# Patient Record
Sex: Female | Born: 2000 | Race: White | Hispanic: No | Marital: Single | Smoking: Never smoker
Health system: Southern US, Community
[De-identification: ages and names within clinical notes are randomized; demographics above are authoritative.]

## PROBLEM LIST (undated history)

## (undated) DIAGNOSIS — J45909 Unspecified asthma, uncomplicated: Secondary | ICD-10-CM

---
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
## 2020-12-13 IMAGING — DX DG CHEST 1V PORT
1 series · 1 of 1 positions shown · non-contrast
Comparison: None.

CLINICAL DATA: Shortness of breath.

EXAM:
PORTABLE CHEST 1 VIEW

[chest ap]
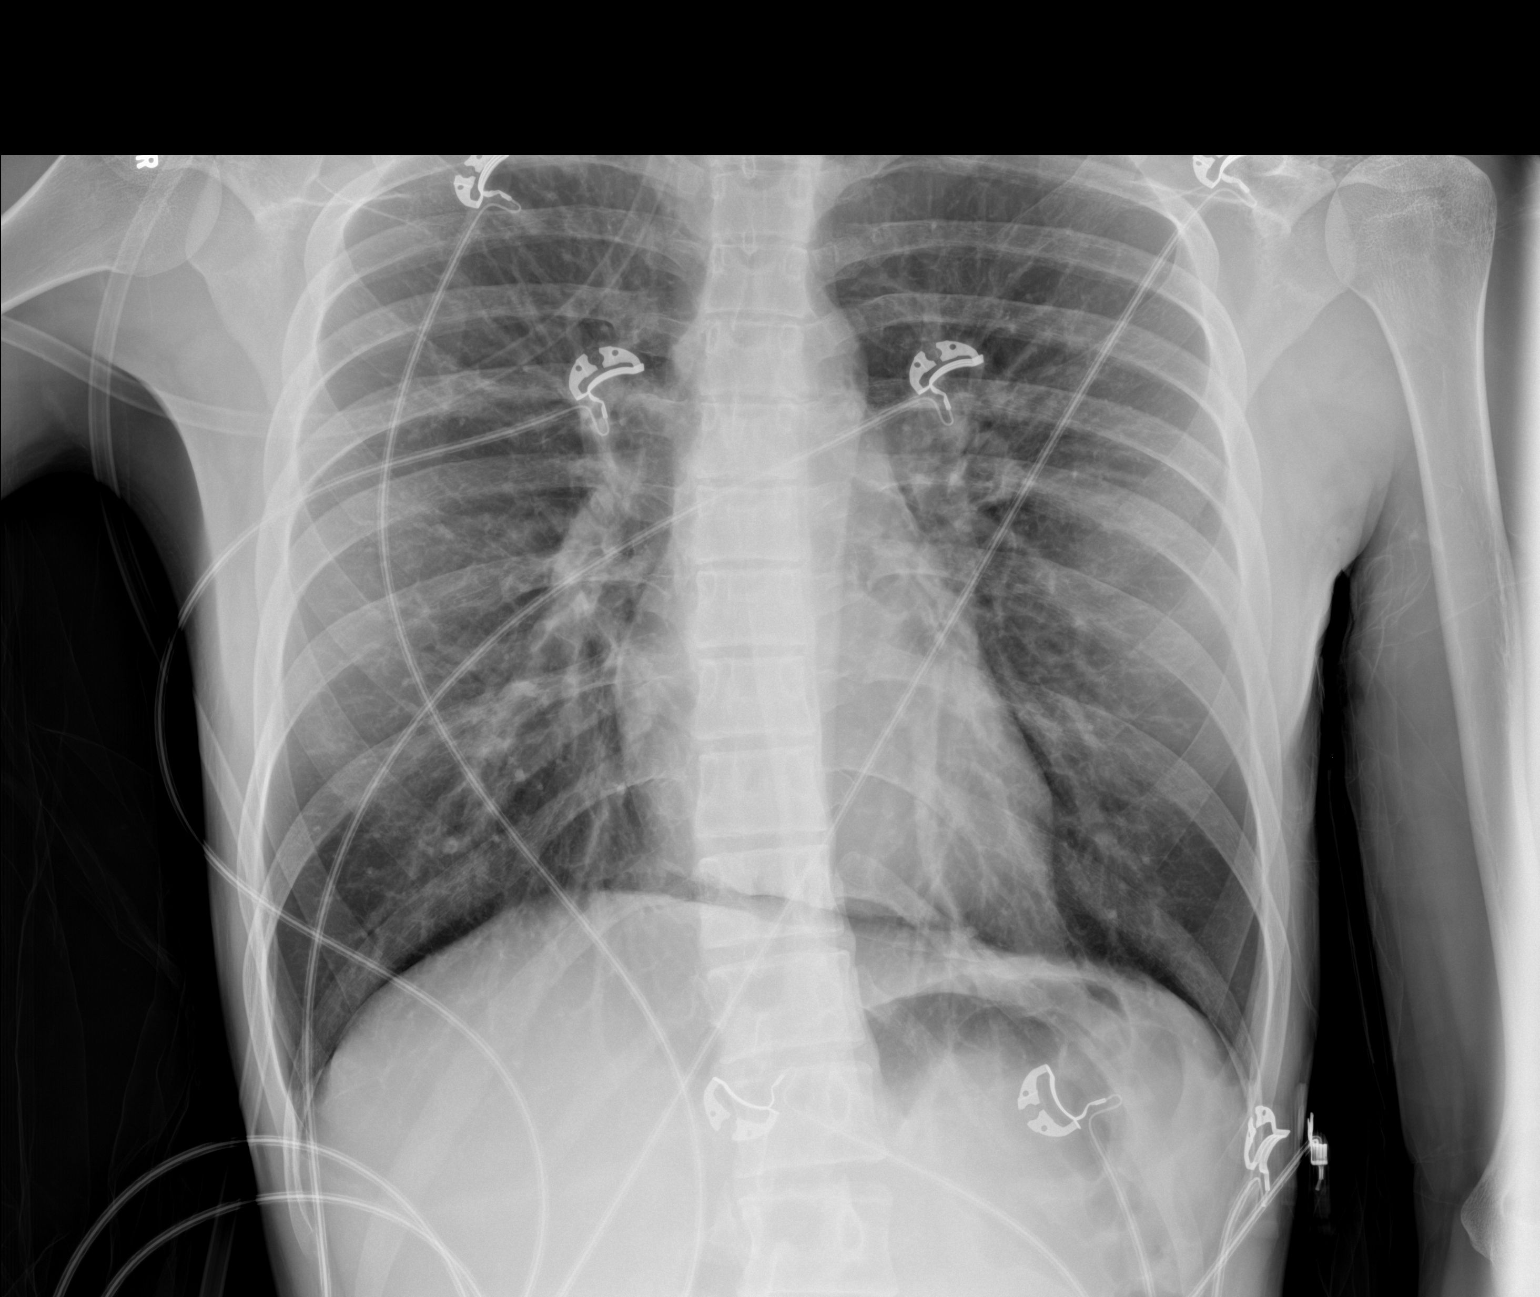

[1 of 1 positions shown; findings below may reference images not displayed]

FINDINGS: There is no pneumothorax. No focal infiltrate. No large pleural
effusion. The heart size is normal. There is a lucency projecting
beneath the cardiac silhouette. There is no acute osseous
abnormality.
IMPRESSION: 1. No definite acute cardiopulmonary process.
2. Lucency projecting beneath the cardiac silhouette is likely
artifact, however a follow-up two-view chest x-ray is recommended.

## 2020-12-13 IMAGING — CR DG CHEST 2V
1 series · 2 of 2 positions shown · non-contrast
Comparison: Chest x-ray from earlier today

CLINICAL DATA: Abnormal one view chest x-ray

EXAM:
CHEST - 2 VIEW

[Series 1: dg chest 2 view · 0.14mm/px · 2 of 2 slices shown]
[im 1/2]
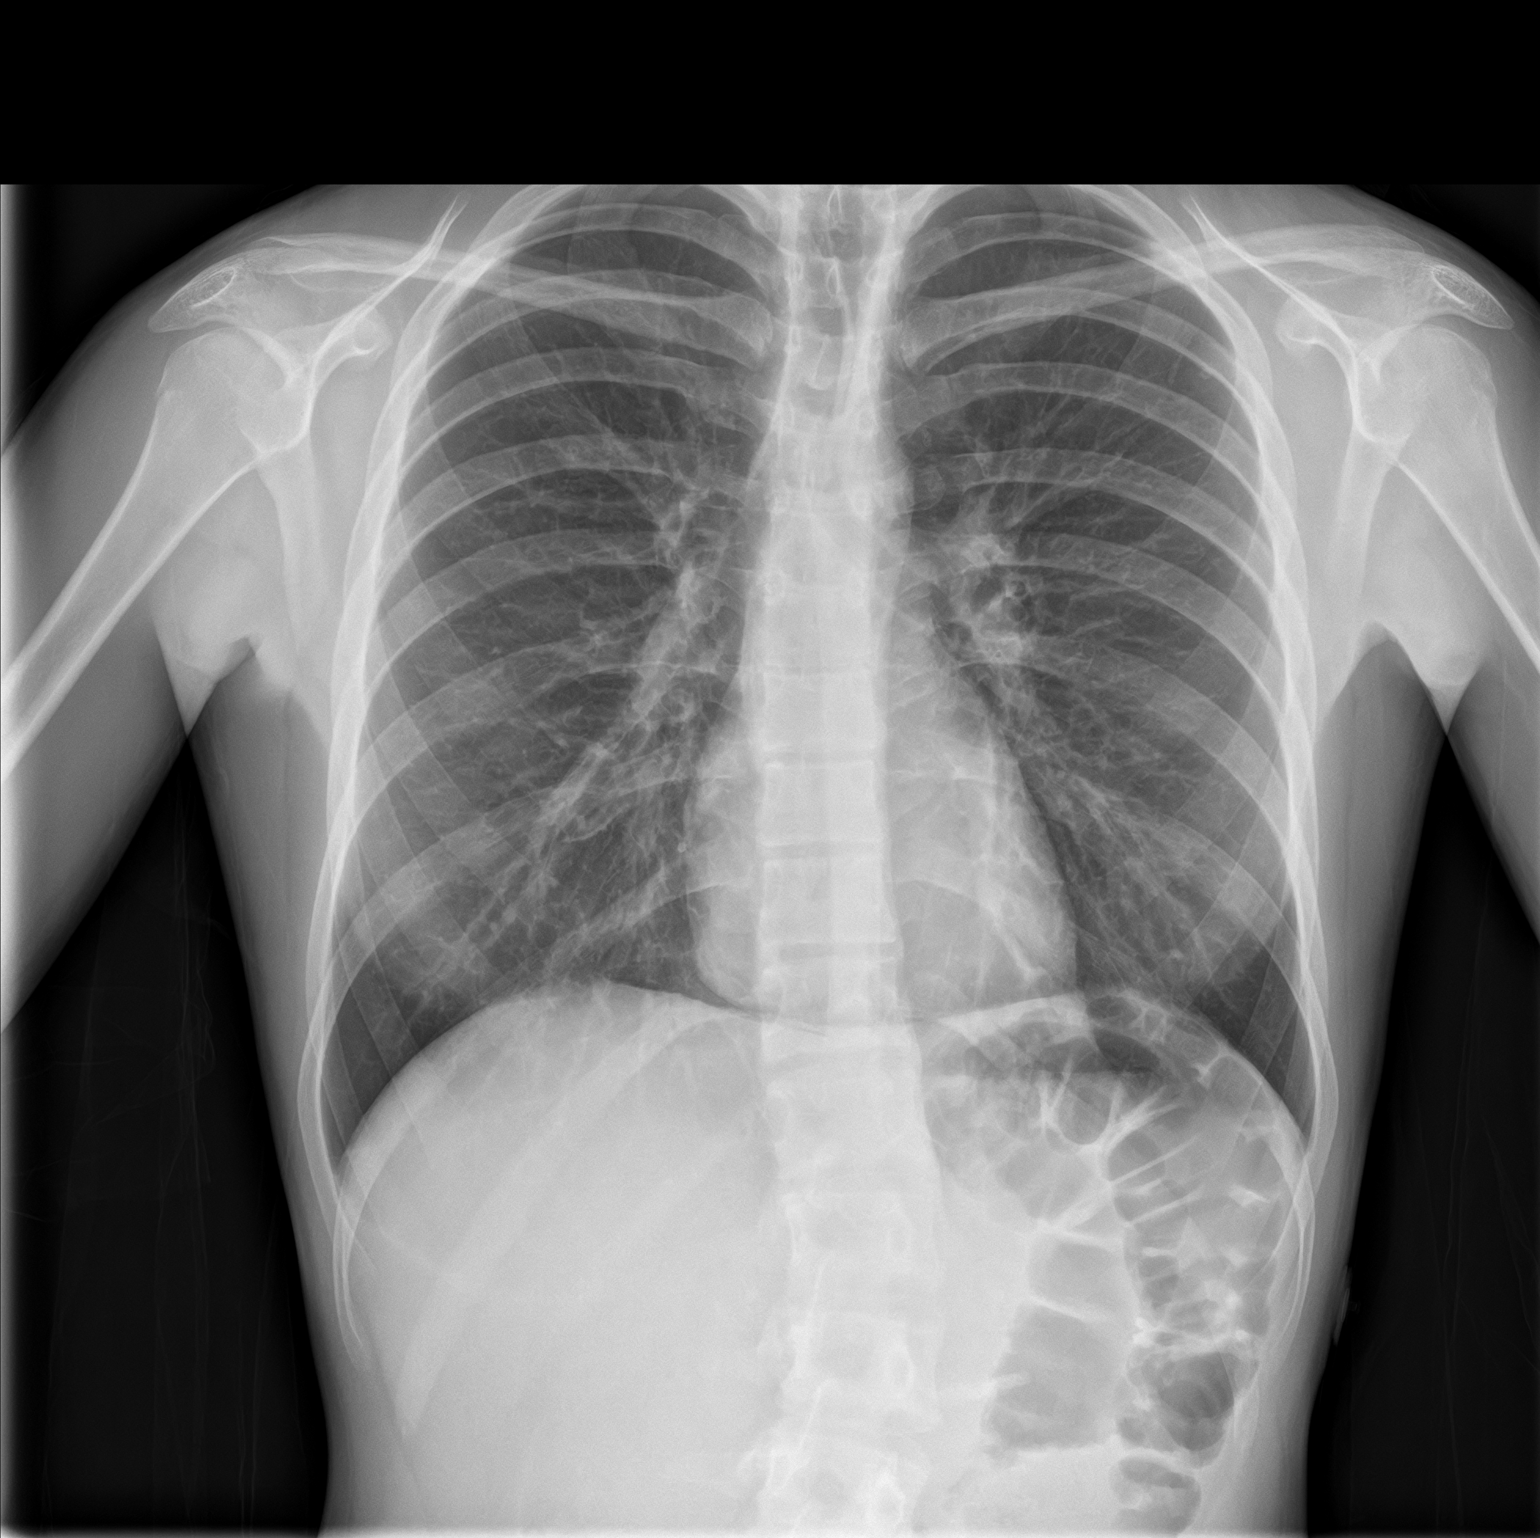
[im 2/2]
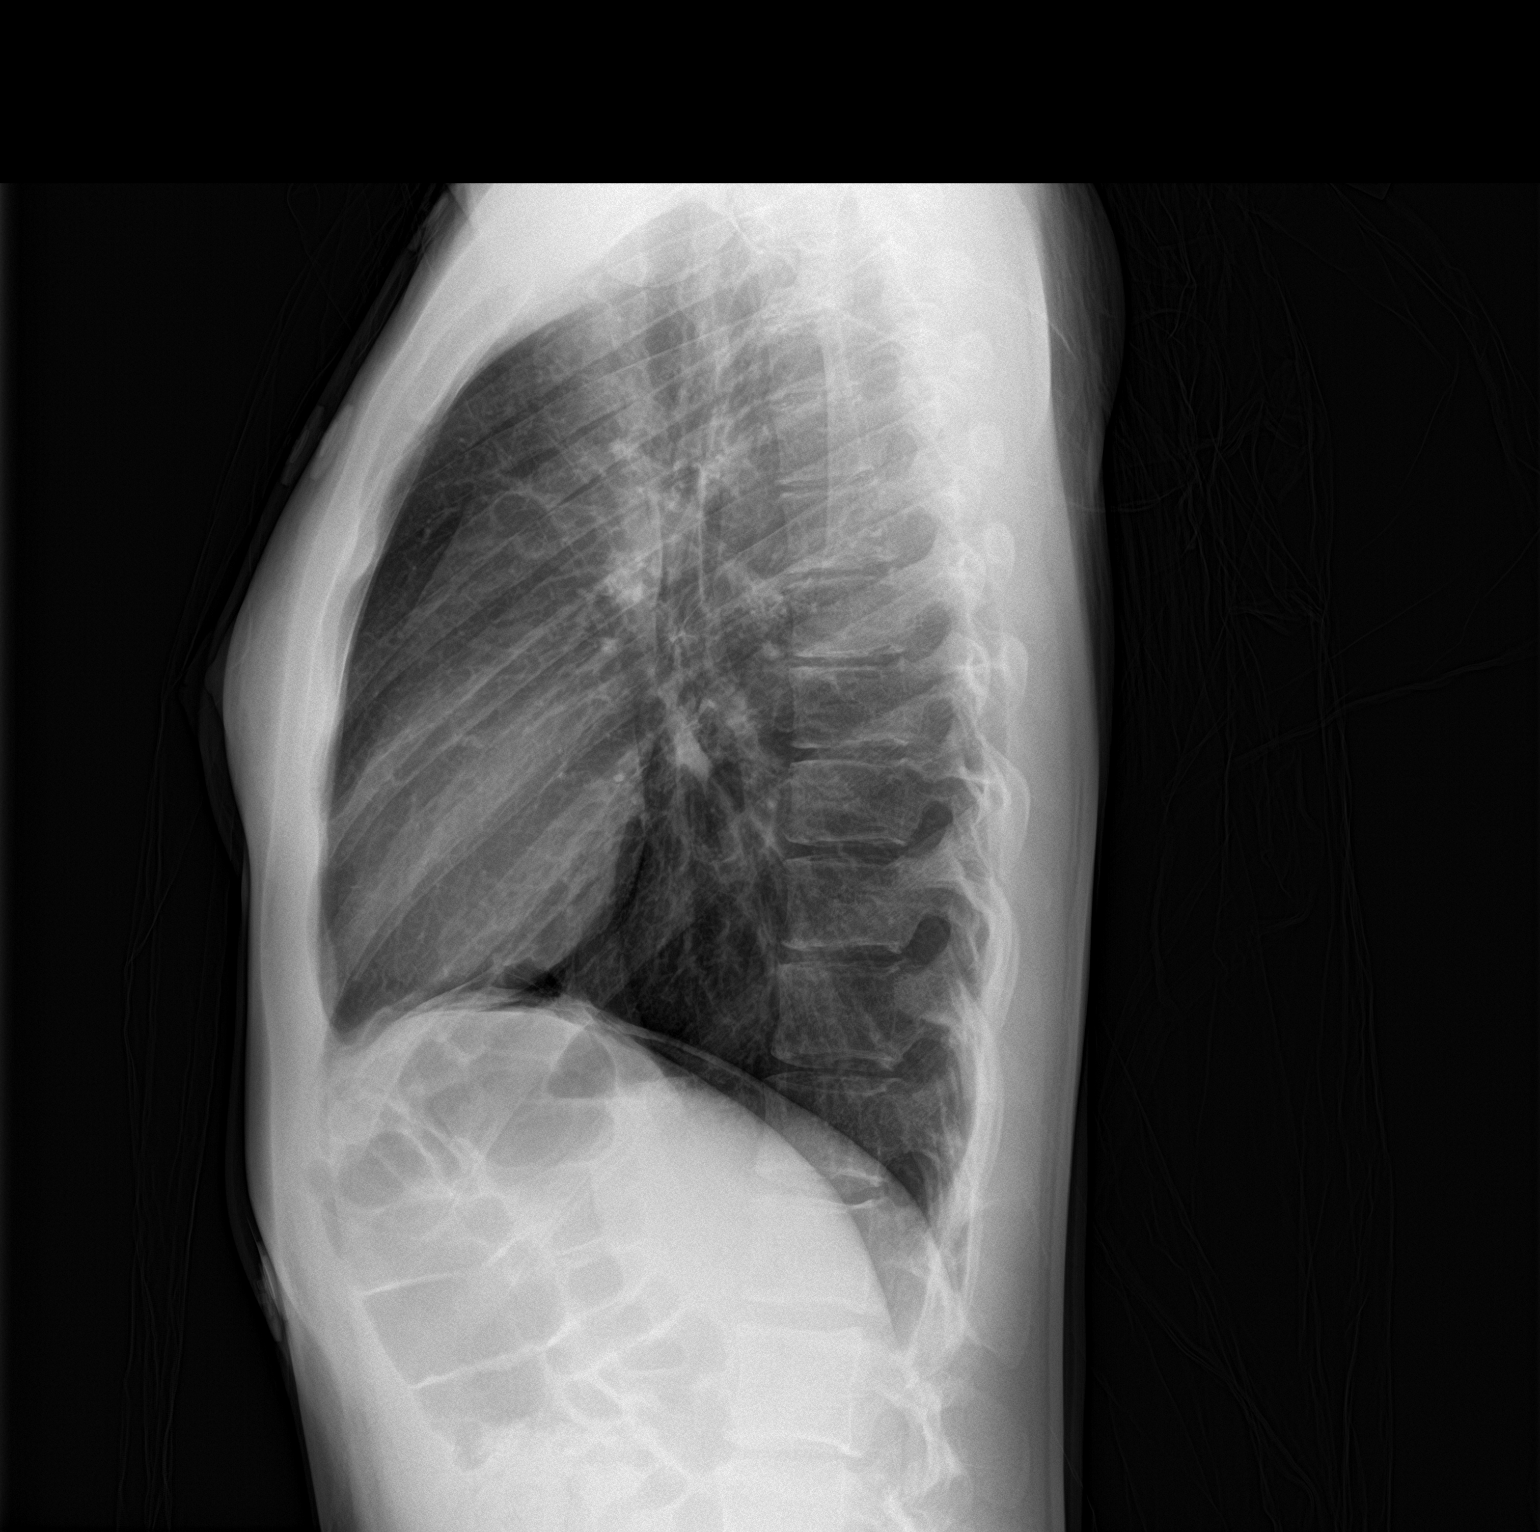

[2 of 2 positions shown; findings below may reference images not displayed]

FINDINGS: Persistent unexpected lucency extending along the inferior heart
from the right. On the lateral view there is also linear lucency
projecting beneath and posterior to the heart. No gas accumulation
beneath the diaphragm or circumferential tracking around the
pericardium, appearance is most consistent with pneumomediastinum.
There is a chart history of asthma. No associated pleural fluid or
pulmonary infiltrate. Heart size is normal. Lumbar levoscoliosis.
IMPRESSION: 1. Persistent abnormal lucency below the heart that is consistent
with pneumomediastinum. Noted presenting history of asthma attack.
2. Clear lungs.
3. Lumbar scoliosis.

## 2022-04-30 DIAGNOSIS — R110 Nausea: Secondary | ICD-10-CM | POA: Diagnosis not present

## 2022-04-30 DIAGNOSIS — R059 Cough, unspecified: Secondary | ICD-10-CM | POA: Diagnosis not present

## 2022-04-30 DIAGNOSIS — R197 Diarrhea, unspecified: Secondary | ICD-10-CM | POA: Diagnosis not present

## 2022-04-30 DIAGNOSIS — R0981 Nasal congestion: Secondary | ICD-10-CM | POA: Diagnosis not present

## 2022-08-03 DIAGNOSIS — Z7189 Other specified counseling: Secondary | ICD-10-CM | POA: Diagnosis not present

## 2022-08-03 DIAGNOSIS — Z0001 Encounter for general adult medical examination with abnormal findings: Secondary | ICD-10-CM | POA: Diagnosis not present

## 2022-08-03 DIAGNOSIS — Z6823 Body mass index (BMI) 23.0-23.9, adult: Secondary | ICD-10-CM | POA: Diagnosis not present

## 2022-08-03 DIAGNOSIS — Z1330 Encounter for screening examination for mental health and behavioral disorders, unspecified: Secondary | ICD-10-CM | POA: Diagnosis not present

## 2022-08-03 DIAGNOSIS — Z713 Dietary counseling and surveillance: Secondary | ICD-10-CM | POA: Diagnosis not present

## 2022-08-03 DIAGNOSIS — J4540 Moderate persistent asthma, uncomplicated: Secondary | ICD-10-CM | POA: Diagnosis not present

## 2022-08-18 DIAGNOSIS — J4541 Moderate persistent asthma with (acute) exacerbation: Secondary | ICD-10-CM | POA: Diagnosis not present

## 2022-08-18 DIAGNOSIS — J189 Pneumonia, unspecified organism: Secondary | ICD-10-CM | POA: Diagnosis not present

## 2023-01-11 DIAGNOSIS — Z111 Encounter for screening for respiratory tuberculosis: Secondary | ICD-10-CM | POA: Diagnosis not present

## 2023-01-11 DIAGNOSIS — Z23 Encounter for immunization: Secondary | ICD-10-CM | POA: Diagnosis not present

## 2023-01-13 DIAGNOSIS — Z0279 Encounter for issue of other medical certificate: Secondary | ICD-10-CM | POA: Diagnosis not present

## 2023-01-13 DIAGNOSIS — Z111 Encounter for screening for respiratory tuberculosis: Secondary | ICD-10-CM | POA: Diagnosis not present
